# Patient Record
Sex: Female | Born: 1985 | Race: Black or African American | Hispanic: No | Marital: Married | Smoking: Never smoker
Health system: Southern US, Community
[De-identification: ages and names within clinical notes are randomized; demographics above are authoritative.]

## PROBLEM LIST (undated history)

## (undated) DIAGNOSIS — Z8489 Family history of other specified conditions: Secondary | ICD-10-CM

## (undated) DIAGNOSIS — N979 Female infertility, unspecified: Secondary | ICD-10-CM

## (undated) DIAGNOSIS — Z973 Presence of spectacles and contact lenses: Secondary | ICD-10-CM

## (undated) DIAGNOSIS — N971 Female infertility of tubal origin: Secondary | ICD-10-CM

---

## 2006-10-16 ENCOUNTER — Emergency Department (HOSPITAL_COMMUNITY): Admission: EM | Admit: 2006-10-16 | Discharge: 2006-10-16 | Payer: Self-pay | Admitting: Family Medicine

## 2007-03-31 ENCOUNTER — Other Ambulatory Visit: Admission: RE | Admit: 2007-03-31 | Discharge: 2007-03-31 | Payer: Self-pay | Admitting: *Deleted

## 2008-01-10 ENCOUNTER — Inpatient Hospital Stay (HOSPITAL_COMMUNITY): Admission: AD | Admit: 2008-01-10 | Discharge: 2008-01-13 | Payer: Self-pay | Admitting: Obstetrics and Gynecology

## 2008-01-11 ENCOUNTER — Encounter (INDEPENDENT_AMBULATORY_CARE_PROVIDER_SITE_OTHER): Payer: Self-pay | Admitting: Obstetrics and Gynecology

## 2008-07-24 ENCOUNTER — Encounter: Admission: RE | Admit: 2008-07-24 | Discharge: 2008-07-24 | Payer: Self-pay | Admitting: Family Medicine

## 2008-08-15 ENCOUNTER — Other Ambulatory Visit: Admission: RE | Admit: 2008-08-15 | Discharge: 2008-08-15 | Payer: Self-pay | Admitting: Family Medicine

## 2009-03-13 ENCOUNTER — Encounter: Admission: RE | Admit: 2009-03-13 | Discharge: 2009-03-13 | Payer: Self-pay | Admitting: Family Medicine

## 2009-08-29 ENCOUNTER — Emergency Department (HOSPITAL_COMMUNITY): Admission: EM | Admit: 2009-08-29 | Discharge: 2009-08-29 | Payer: Self-pay | Admitting: Family Medicine

## 2010-07-22 NOTE — Op Note (Signed)
[name], [name]              ACCOUNT NO.:  [id number]   MEDICAL RECORD NO.:  [id number]          PATIENT TYPE:  INP   LOCATION:  9111                          FACILITY:  [facility]   PHYSICIAN:  Maxie Better, M.D.DATE OF BIRTH:  August 27, 1985   DATE OF PROCEDURE:  01/11/2008  DATE OF DISCHARGE:                               OPERATIVE REPORT   PREOPERATIVE DIAGNOSIS:  Nonreassuring fetal tracing, post-term.   PROCEDURES:  Primary cesarean section for hysterotomy, extensive lysis  of adhesions.   POSTOPERATIVE DIAGNOSES:  Post-term, nonreassuring fetal tracing,  extensive pelvic adhesions.   ANESTHESIA:  Epidural.   SURGEON:  Maxie Better, MD   ASSISTANT:  Marlinda Mike, CNM   PROCEDURE IN DETAIL:  Under adequate epidural anesthesia, the patient  was placed in the supine position with a left lateral tilt.  She was  quickly sterilely prepped and draped in the usual fashion.  Indwelling  Foley catheter was already in place.  Marcaine 0.25% was injected along  the planned Pfannenstiel skin incision site.  A Pfannenstiel skin  incision was then made, carried down to the rectus fascia.  Rectus  fascia was opened transversely.  The rectus fascia was then bluntly and  sharply dissected off the rectus muscle in superior and inferior  fashion.  Rectus muscles split in midline.  Multiple attempts at  entering the peritoneum was unsuccessful.  Subsequently, peritoneum was  opened; however, there was felt to be a veil of scar tissue in the upper  part of the abdomen with the inability to palpate to reach the fundus.  The lower uterine segment was identified, attempt at opening the bladder  reflection transversely was limited, and therefore, a curvilinear low  transverse uterine incision was then made high, carried above what was  thought to be the bladder reflection and extended with bandage scissors.  Meconium-stained fluid was noted.  Subsequent delivery of a live female  from an  occiput posterior presentation was accomplished.  The infant was  bulb suctioned and the abdomen cord was clamped, cut, and the baby was  transferred to the awaiting pediatrician with Apgars of 9 and 10 at 1  and 5 minutes.  The placenta was manually removed.  Uterine cavity was  cleaned of debris.  Reattempt at opening of the peritoneum was then  attempted at which time that attempt was successful at the same time,  omental adhesion was noted across the lower segment area.  Once the  peritoneum was opened, the uterus was exteriorized to further assess the  anatomy.  On further assessm ent of the anatomy, the omental adhesion  was adherent to the lower uterine segment all the way across.  The right  tube was attached to the anterior aspect of the uterus superior to the  site of incision  The right ovary was normal and its location. On the  left, the distal portion of the tube  could not be identified at all  only its proximal segment and there was a small nubbing of ovary that  was identified, that appeared to be going underneath the tube as if it  is coming through that mesosalpinx.  The omental adhesion was lysed.  There were filmy/thin adhesions along the whole anterior/superior aspect  of the uterus anteriorly, which was not touched due to concern for  additional bleeding.  The tube and ovary location on the right was not  disturbed due to the fact that this patient obviously got pregnant  without any issue and due to what the left side appears to be, the  uterine incision was then identified, was closed in two layers using 0  Monocryl in running locked stitch, second layer was imbricated using 0  Monocryl stitch.  Opportunity was then taken to test the bladder,  however the normal anatomy in that area appeared distorted and  therefore, a decision was made to retrograde fill the bladder with 200  mL of methylene dye fluid and the bladder integrity was still in place  and there was no  leakage noted at that point.  The abdomen was copiously  irrigated and suctioned of debris.  The uterus was returned back to the  abdomen after all the adhesions had been removed.  Reinspection showed  good hemostasis.  Given the distortion of the anatomy of the bladder,  the peritoneum was not able to be closed.  The rectus fascia was then  closed with 0 Vicryl x2.  The subcutaneous area was irrigated and small  bleeders cauterized.  Interrupted 2-0 plain sutures placed and the skin  approximated using Ethicon staples.   Specimen was placenta, sent to pathology.  Estimated blood loss was 700  mL.  Urine output was 100 mL.  Fluid was 900 mL.  Sponge and instrument  counts x2 was correct.  Complication was none.  Weight of baby was 6  pounds 7 ounces.  The patient tolerated the procedure well, was  transferred to the recovery in stable condition.      Maxie Better, M.D.  Electronically Signed     [place]/MEDQ  D:  01/11/2008  T:  01/12/2008  Job:  [id number]

## 2010-07-25 NOTE — Discharge Summary (Signed)
[name], [name]              ACCOUNT NO.:  [id number]   MEDICAL RECORD NO.:  [id number]          PATIENT TYPE:  INP   LOCATION:  9111                          FACILITY:  [facility]   PHYSICIAN:  Maxie Better, M.D.DATE OF BIRTH:  1986-02-13   DATE OF ADMISSION:  01/10/2008  DATE OF DISCHARGE:  01/13/2008                               DISCHARGE SUMMARY   ADMISSION DIAGNOSIS:  Postterm.   DISCHARGE DIAGNOSES:  Postterm delivered, nonreassuring fetal heart  tracing, presumed chorioamnionitis, and severe pelvic adhesions   PROCEDURE:  Primary cesarean section and extensive lysis of adhesions.   HISTORY OF PRESENT ILLNESS:  A 25 year old gravida 1, para 0 female at  55 and 2/7 weeks' admitted by Marlinda Mike, midwife, for induction of  labor.   HOSPITAL COURSE:  The patient was admitted.  Her cervix was 150%, -3,  and Cytotec was used for cervical ripening.  The patient subsequently  progressed to 5-cm and 9% bulging bag of membranes.  She was doing  natural childbirth and tracing was reassuring.  She then had artificial  rupture of membranes.  Moderate to large amount of green meconium-  stained fluid.  Group B strep was negative.  Internal fetal scalp  electrode applied.  Intrauterine pressure catheter was subsequently  placed.  Amnioinfusion was started.  Epidural was started.  The cervix  had remained at 9 cm, 6, and 0 station.  The patient had a fetal heart  rate deceleration down to the 90s to 95 for 7 minutes.  Maternal blood  pressure was stable.  Ephedrine dose x1 was given without any change in  the fetal heart rate deceleration.  Given the cycle of the cervix had  remained unchanged and the fetal heart deceleration was noted.  Primary  cesarean section was recommended.  The patient was taken to the  operating room where she underwent a primary cesarean section, live  female from 6 pounds 7 ounces, occiput posterior presentation.  The  distal left fallopian tube end was  not seen.  A small left ovary was  noted, adherent.  The right tube and ovary were adherent to the anterior  uterus.  Omental adhesions at the bladder infection was noted.  Apgars  of 9 and 10.  Lysis of adhesions were carefully performed in order to do  the cesarean section.  Please see the dictated operative note for  specific details.  The patient was started on gentamicin and  clindamycin.  Postoperatively, she was continued on antibiotics.  On  postop day #2, the patient requesting to be discharged home.  Her  antibiotics at discharge, discontinued after 24 hours.  Her CBC, on  postop day #1, showed a hemoglobin 10.2, hematocrit 30.8, platelet count  of 105,000, and white count of 12.2.  Placenta was sent to Pathology and  it was matured placenta.  The patient's incision had no evidence of  infection or drainage.  She was deemed well to be discharged home.   DISPOSITION:  Home.   CONDITION:  Stable.   DISCHARGE MEDICATIONS:  1. Percocet 1-2 tablets every 6 hours p.r.n. pain.  2.  Motrin 600-800 mg every 8 hours p.r.n. pain.  3. Prenatal vitamins 1 p.o. daily.   FOLLOWUP APPOINTMENT:  At [facility] for staple removal on Monday  and 6 weeks postpartum.   DISCHARGE INSTRUCTIONS:  Per the postpartum booklet given.      Maxie Better, M.D.  Electronically Signed     [place]/MEDQ  D:  03/04/2008  T:  03/04/2008  Job:  [id number]

## 2010-10-06 ENCOUNTER — Other Ambulatory Visit (HOSPITAL_COMMUNITY)
Admission: RE | Admit: 2010-10-06 | Discharge: 2010-10-06 | Disposition: A | Discharge status: Home / Self Care | Payer: 59 | Source: Ambulatory Visit | Attending: Obstetrics and Gynecology | Admitting: Obstetrics and Gynecology

## 2010-10-06 ENCOUNTER — Other Ambulatory Visit: Payer: Self-pay | Admitting: Obstetrics and Gynecology

## 2010-10-06 DIAGNOSIS — Z01419 Encounter for gynecological examination (general) (routine) without abnormal findings: Secondary | ICD-10-CM | POA: Insufficient documentation

## 2010-12-09 LAB — CBC
HCT: 39.1
Hemoglobin: 12.9
MCHC: 33
MCHC: 33.1
MCV: 91.2
Platelets: 281
RBC: 3.34 — ABNORMAL LOW
RDW: 14.3
RDW: 14.9

## 2013-03-09 HISTORY — PX: WISDOM TOOTH EXTRACTION: SHX21

## 2013-04-28 ENCOUNTER — Other Ambulatory Visit (HOSPITAL_COMMUNITY): Payer: Self-pay | Admitting: Obstetrics and Gynecology

## 2013-04-28 DIAGNOSIS — N736 Female pelvic peritoneal adhesions (postinfective): Secondary | ICD-10-CM

## 2013-04-28 DIAGNOSIS — IMO0002 Reserved for concepts with insufficient information to code with codable children: Secondary | ICD-10-CM

## 2013-05-04 ENCOUNTER — Ambulatory Visit (HOSPITAL_COMMUNITY): Payer: PRIVATE HEALTH INSURANCE

## 2013-05-08 ENCOUNTER — Ambulatory Visit (HOSPITAL_COMMUNITY)
Admission: RE | Admit: 2013-05-08 | Discharge: 2013-05-08 | Disposition: A | Discharge status: Home / Self Care | Payer: PRIVATE HEALTH INSURANCE | Source: Ambulatory Visit | Attending: Obstetrics and Gynecology | Admitting: Obstetrics and Gynecology

## 2013-05-08 DIAGNOSIS — N7013 Chronic salpingitis and oophoritis: Secondary | ICD-10-CM | POA: Insufficient documentation

## 2013-05-08 DIAGNOSIS — IMO0002 Reserved for concepts with insufficient information to code with codable children: Secondary | ICD-10-CM

## 2013-05-08 DIAGNOSIS — N736 Female pelvic peritoneal adhesions (postinfective): Secondary | ICD-10-CM

## 2013-05-08 DIAGNOSIS — N971 Female infertility of tubal origin: Secondary | ICD-10-CM | POA: Insufficient documentation

## 2013-05-08 MED ORDER — IOHEXOL 300 MG/ML  SOLN
20.0000 mL | Freq: Once | INTRAMUSCULAR | Status: AC | PRN
  Administered 2013-05-08: 17 mL

## 2014-05-11 ENCOUNTER — Encounter (HOSPITAL_BASED_OUTPATIENT_CLINIC_OR_DEPARTMENT_OTHER): Payer: Self-pay | Admitting: *Deleted

## 2014-05-11 NOTE — Progress Notes (Signed)
NPO AFTER MN. ARRIVE AT 0815. NEEDS HG AND URINE PREG.  

## 2014-05-16 ENCOUNTER — Ambulatory Visit (HOSPITAL_BASED_OUTPATIENT_CLINIC_OR_DEPARTMENT_OTHER): Payer: 59 | Admitting: Anesthesiology

## 2014-05-16 ENCOUNTER — Ambulatory Visit (HOSPITAL_COMMUNITY)
Admission: RE | Admit: 2014-05-16 | Discharge: 2014-05-16 | Disposition: A | Discharge status: Home / Self Care | Payer: 59 | Source: Ambulatory Visit | Attending: Obstetrics and Gynecology | Admitting: Obstetrics and Gynecology

## 2014-05-16 ENCOUNTER — Encounter (HOSPITAL_BASED_OUTPATIENT_CLINIC_OR_DEPARTMENT_OTHER)
Admission: RE | Disposition: A | Discharge status: Home / Self Care | Payer: Self-pay | Source: Ambulatory Visit | Attending: Obstetrics and Gynecology

## 2014-05-16 ENCOUNTER — Encounter (HOSPITAL_BASED_OUTPATIENT_CLINIC_OR_DEPARTMENT_OTHER): Payer: Self-pay | Admitting: *Deleted

## 2014-05-16 DIAGNOSIS — N7011 Chronic salpingitis: Secondary | ICD-10-CM | POA: Insufficient documentation

## 2014-05-16 DIAGNOSIS — N736 Female pelvic peritoneal adhesions (postinfective): Secondary | ICD-10-CM | POA: Insufficient documentation

## 2014-05-16 DIAGNOSIS — N971 Female infertility of tubal origin: Secondary | ICD-10-CM | POA: Insufficient documentation

## 2014-05-16 HISTORY — DX: Family history of other specified conditions: Z84.89

## 2014-05-16 HISTORY — DX: Female infertility, unspecified: N97.9

## 2014-05-16 HISTORY — DX: Presence of spectacles and contact lenses: Z97.3

## 2014-05-16 HISTORY — DX: Female infertility of tubal origin: N97.1

## 2014-05-16 HISTORY — PX: LAPAROSCOPIC BILATERAL SALPINGECTOMY: SHX5889

## 2014-05-16 LAB — POCT PREGNANCY, URINE: PREG TEST UR: NEGATIVE

## 2014-05-16 LAB — POCT HEMOGLOBIN-HEMACUE: HEMOGLOBIN: 11.1 g/dL — AB (ref 12.0–15.0)

## 2014-05-16 SURGERY — SALPINGECTOMY, BILATERAL, LAPAROSCOPIC
Anesthesia: General | Site: Abdomen | Laterality: Bilateral

## 2014-05-16 MED ORDER — MIDAZOLAM HCL 5 MG/5ML IJ SOLN
INTRAMUSCULAR | Status: DC | PRN
  Administered 2014-05-16: 2 mg via INTRAVENOUS

## 2014-05-16 MED ORDER — KETOROLAC TROMETHAMINE 30 MG/ML IJ SOLN
INTRAMUSCULAR | Status: DC | PRN
  Administered 2014-05-16: 30 mg via INTRAVENOUS

## 2014-05-16 MED ORDER — FENTANYL CITRATE 0.05 MG/ML IJ SOLN
INTRAMUSCULAR | Status: DC | PRN
  Administered 2014-05-16 (×4): 50 ug via INTRAVENOUS

## 2014-05-16 MED ORDER — PHENYLEPHRINE HCL 10 MG/ML IJ SOLN
INTRAMUSCULAR | Status: DC | PRN
  Administered 2014-05-16: 160 ug via INTRAVENOUS

## 2014-05-16 MED ORDER — FENTANYL CITRATE 0.05 MG/ML IJ SOLN
INTRAMUSCULAR | Status: AC
  Filled 2014-05-16: qty 4

## 2014-05-16 MED ORDER — LACTATED RINGERS IR SOLN
Status: DC | PRN
  Administered 2014-05-16: 3000 mL

## 2014-05-16 MED ORDER — LACTATED RINGERS IV SOLN
INTRAVENOUS | Status: DC
  Administered 2014-05-16 (×2): via INTRAVENOUS
  Filled 2014-05-16: qty 1000

## 2014-05-16 MED ORDER — ONDANSETRON HCL 4 MG PO TABS: 4.0000 mg | ORAL_TABLET | Freq: Three times a day (TID) | ORAL | Status: AC | PRN

## 2014-05-16 MED ORDER — ROCURONIUM BROMIDE 100 MG/10ML IV SOLN
INTRAVENOUS | Status: DC | PRN
  Administered 2014-05-16: 40 mg via INTRAVENOUS

## 2014-05-16 MED ORDER — PROPOFOL 10 MG/ML IV BOLUS
INTRAVENOUS | Status: DC | PRN
  Administered 2014-05-16: 150 mg via INTRAVENOUS

## 2014-05-16 MED ORDER — OXYCODONE-ACETAMINOPHEN 7.5-325 MG PO TABS: 1.0000 | ORAL_TABLET | ORAL | Status: AC | PRN

## 2014-05-16 MED ORDER — DEXAMETHASONE SODIUM PHOSPHATE 4 MG/ML IJ SOLN
INTRAMUSCULAR | Status: DC | PRN
  Administered 2014-05-16: 10 mg via INTRAVENOUS

## 2014-05-16 MED ORDER — PROMETHAZINE HCL 25 MG/ML IJ SOLN
6.2500 mg | INTRAMUSCULAR | Status: DC | PRN
  Filled 2014-05-16: qty 1

## 2014-05-16 MED ORDER — CEFAZOLIN SODIUM-DEXTROSE 2-3 GM-% IV SOLR
2.0000 g | INTRAVENOUS | Status: AC
  Administered 2014-05-16: 2 g via INTRAVENOUS
  Filled 2014-05-16: qty 50

## 2014-05-16 MED ORDER — NEOSTIGMINE METHYLSULFATE 10 MG/10ML IV SOLN
INTRAVENOUS | Status: DC | PRN
  Administered 2014-05-16: 3 mg via INTRAVENOUS

## 2014-05-16 MED ORDER — ACETAMINOPHEN 10 MG/ML IV SOLN
INTRAVENOUS | Status: DC | PRN
  Administered 2014-05-16: 1000 mg via INTRAVENOUS

## 2014-05-16 MED ORDER — LIDOCAINE HCL (CARDIAC) 20 MG/ML IV SOLN
INTRAVENOUS | Status: DC | PRN
  Administered 2014-05-16: 50 mg via INTRAVENOUS

## 2014-05-16 MED ORDER — BUPIVACAINE-EPINEPHRINE 0.25% -1:200000 IJ SOLN
INTRAMUSCULAR | Status: DC | PRN
  Administered 2014-05-16: 6 mL

## 2014-05-16 MED ORDER — FENTANYL CITRATE 0.05 MG/ML IJ SOLN
25.0000 ug | INTRAMUSCULAR | Status: DC | PRN
  Filled 2014-05-16: qty 1

## 2014-05-16 MED ORDER — MIDAZOLAM HCL 2 MG/2ML IJ SOLN
INTRAMUSCULAR | Status: AC
  Filled 2014-05-16: qty 2

## 2014-05-16 MED ORDER — ONDANSETRON HCL 4 MG/2ML IJ SOLN
INTRAMUSCULAR | Status: DC | PRN
  Administered 2014-05-16: 4 mg via INTRAVENOUS

## 2014-05-16 MED ORDER — GLYCOPYRROLATE 0.2 MG/ML IJ SOLN
INTRAMUSCULAR | Status: DC | PRN
  Administered 2014-05-16: 0.4 mg via INTRAVENOUS
  Administered 2014-05-16: 0.2 mg via INTRAVENOUS

## 2014-05-16 MED ORDER — SODIUM CHLORIDE 0.9 % IR SOLN
Status: DC | PRN
  Administered 2014-05-16: 500 mL

## 2014-05-16 MED ORDER — LACTATED RINGERS IV SOLN
INTRAVENOUS | Status: DC
  Filled 2014-05-16: qty 1000

## 2014-05-16 MED ORDER — MEPERIDINE HCL 25 MG/ML IJ SOLN
6.2500 mg | INTRAMUSCULAR | Status: DC | PRN
  Filled 2014-05-16: qty 1

## 2014-05-16 MED ORDER — METHYLENE BLUE 1 % INJ SOLN
INTRAMUSCULAR | Status: DC | PRN
  Administered 2014-05-16: 1 mL

## 2014-05-16 MED ORDER — CEFAZOLIN SODIUM-DEXTROSE 2-3 GM-% IV SOLR
INTRAVENOUS | Status: AC
  Filled 2014-05-16: qty 50

## 2014-05-16 MED ORDER — STERILE WATER FOR IRRIGATION IR SOLN
Status: DC | PRN
  Administered 2014-05-16: 1000 mL

## 2014-05-16 SURGICAL SUPPLY — 56 items
APPLICATOR COTTON TIP 6IN STRL (MISCELLANEOUS) ×2 IMPLANT
BAG URINE DRAINAGE (UROLOGICAL SUPPLIES) ×2 IMPLANT
BENZOIN TINCTURE PRP APPL 2/3 (GAUZE/BANDAGES/DRESSINGS) ×2 IMPLANT
BLADE SURG 11 STRL SS (BLADE) ×2 IMPLANT
CATH FOLEY 2WAY SLVR  5CC 14FR (CATHETERS) ×1
CATH FOLEY 2WAY SLVR 5CC 14FR (CATHETERS) ×1 IMPLANT
CATH ROBINSON RED A/P 16FR (CATHETERS) ×2 IMPLANT
CATH SALPINGOGRAPHY SELECT (BALLOONS) ×2 IMPLANT
COVER MAYO STAND STRL (DRAPES) ×4 IMPLANT
COVER TABLE BACK 60X90 (DRAPES) ×2 IMPLANT
DRAPE UNDERBUTTOCKS STRL (DRAPE) ×2 IMPLANT
ELECT REM PT RETURN 9FT ADLT (ELECTROSURGICAL) ×2
ELECTRODE REM PT RTRN 9FT ADLT (ELECTROSURGICAL) ×1 IMPLANT
GLOVE BIO SURGEON STRL SZ 6.5 (GLOVE) ×8 IMPLANT
GLOVE BIOGEL PI IND STRL 7.5 (GLOVE) ×1 IMPLANT
GLOVE BIOGEL PI IND STRL 8.5 (GLOVE) ×2 IMPLANT
GLOVE BIOGEL PI INDICATOR 7.5 (GLOVE) ×1
GLOVE BIOGEL PI INDICATOR 8.5 (GLOVE) ×2
GLOVE INDICATOR 6.5 STRL GRN (GLOVE) ×6 IMPLANT
GLOVE INDICATOR 7.5 STRL GRN (GLOVE) ×6 IMPLANT
GOWN STRL REUS W/ TWL LRG LVL3 (GOWN DISPOSABLE) ×2 IMPLANT
GOWN STRL REUS W/ TWL XL LVL3 (GOWN DISPOSABLE) ×1 IMPLANT
GOWN STRL REUS W/TWL LRG LVL3 (GOWN DISPOSABLE) ×2
GOWN STRL REUS W/TWL XL LVL3 (GOWN DISPOSABLE) ×1
IV LACTATED RINGER IRRG 3000ML (IV SOLUTION) ×1
IV LR IRRIG 3000ML ARTHROMATIC (IV SOLUTION) ×1 IMPLANT
MANIPULATOR UTERINE 4.5 ZUMI (MISCELLANEOUS) ×2 IMPLANT
NEEDLE HYPO 25X1 1.5 SAFETY (NEEDLE) ×2 IMPLANT
NEEDLE INSUFFLATION 14GA 120MM (NEEDLE) ×2 IMPLANT
NEEDLE SPNL 20GX3.5 QUINCKE YW (NEEDLE) ×2 IMPLANT
NS IRRIG 500ML POUR BTL (IV SOLUTION) ×2 IMPLANT
PACK BASIN DAY SURGERY FS (CUSTOM PROCEDURE TRAY) ×2 IMPLANT
PACK LAPAROSCOPY II (CUSTOM PROCEDURE TRAY) ×2 IMPLANT
PAD OB MATERNITY 4.3X12.25 (PERSONAL CARE ITEMS) ×2 IMPLANT
PLUG CATH AND CAP STER (CATHETERS) ×2 IMPLANT
SCALPEL HARMONIC ACE (MISCELLANEOUS) ×2 IMPLANT
SEPRAFILM MEMBRANE 5X6 (MISCELLANEOUS) ×4 IMPLANT
SET IRRIG TUBING LAPAROSCOPIC (IRRIGATION / IRRIGATOR) ×2 IMPLANT
SOLUTION ANTI FOG 6CC (MISCELLANEOUS) ×2 IMPLANT
STOPCOCK 4 WAY LG BORE MALE ST (IV SETS) ×2 IMPLANT
STRIP CLOSURE SKIN 1/2X4 (GAUZE/BANDAGES/DRESSINGS) ×2 IMPLANT
SUT MON AB-0 CT1 36 (SUTURE) ×2 IMPLANT
SUT VIC AB 2-0 UR6 27 (SUTURE) ×2 IMPLANT
SYR 20CC LL (SYRINGE) ×2 IMPLANT
SYR 30ML LL (SYRINGE) ×2 IMPLANT
SYR 3ML 23GX1 SAFETY (SYRINGE) ×2 IMPLANT
SYR 5ML LL (SYRINGE) ×2 IMPLANT
SYR CONTROL 10ML LL (SYRINGE) ×4 IMPLANT
SYRINGE 10CC LL (SYRINGE) ×2 IMPLANT
SYRINGE IRR TOOMEY STRL 70CC (SYRINGE) ×2 IMPLANT
TOWEL OR 17X24 6PK STRL BLUE (TOWEL DISPOSABLE) ×6 IMPLANT
TRAY DSU PREP LF (CUSTOM PROCEDURE TRAY) ×2 IMPLANT
TROCAR OPTI TIP 5M 100M (ENDOMECHANICALS) ×2 IMPLANT
TUBING INSUFFLATION 10FT LAP (TUBING) ×2 IMPLANT
WARMER LAPAROSCOPE (MISCELLANEOUS) ×2 IMPLANT
WATER STERILE IRR 500ML POUR (IV SOLUTION) ×4 IMPLANT

## 2014-05-16 NOTE — Anesthesia Preprocedure Evaluation (Addendum)
Anesthesia Evaluation  Patient identified by MRN, date of birth, ID band Patient awake    Reviewed: Allergy & Precautions, NPO status , Patient's Chart, lab work & pertinent test results  Airway Mallampati: II  TM Distance: >3 FB Neck ROM: Full    Dental no notable dental hx. (+) Teeth Intact, Dental Advisory Given   Pulmonary neg pulmonary ROS,  breath sounds clear to auscultation  Pulmonary exam normal       Cardiovascular Exercise Tolerance: Good negative cardio ROS  Rhythm:Regular Rate:Normal     Neuro/Psych negative neurological ROS  negative psych ROS   GI/Hepatic negative GI ROS, Neg liver ROS,   Endo/Other  negative endocrine ROS  Renal/GU negative Renal ROS  negative genitourinary   Musculoskeletal negative musculoskeletal ROS (+)   Abdominal   Peds negative pediatric ROS (+)  Hematology negative hematology ROS (+)   Anesthesia Other Findings   Reproductive/Obstetrics negative OB ROS                           Anesthesia Physical Anesthesia Plan  ASA: I  Anesthesia Plan: General   Post-op Pain Management:    Induction: Intravenous  Airway Management Planned: Oral ETT  Additional Equipment:   Intra-op Plan:   Post-operative Plan: Extubation in OR  Informed Consent: I have reviewed the patients History and Physical, chart, labs and discussed the procedure including the risks, benefits and alternatives for the proposed anesthesia with the patient or authorized representative who has indicated his/her understanding and acceptance.   Dental advisory given  Plan Discussed with: CRNA and Anesthesiologist  Anesthesia Plan Comments:        Anesthesia Quick Evaluation

## 2014-05-16 NOTE — Anesthesia Postprocedure Evaluation (Signed)
  Anesthesia Post-op Note  Patient: [name]  Procedure(s) Performed: Procedure(s) (LRB): LAPAROSCOPY/LYSIS OF ADHESIONS/CHROMOTUBATION; BILATERAL SALPINGECTOMY (Bilateral)  Patient Location: PACU  Anesthesia Type: General  Level of Consciousness: awake and alert   Airway and Oxygen Therapy: Patient Spontanous Breathing  Post-op Pain: mild  Post-op Assessment: Post-op Vital signs reviewed, Patient's Cardiovascular Status Stable, Respiratory Function Stable, Patent Airway and No signs of Nausea or vomiting  Last Vitals:  Filed Vitals:   05/16/14 1731  BP: 114/70  Pulse:   Temp: 37.1 C  Resp: 16    Post-op Vital Signs: stable   Complications: No apparent anesthesia complications

## 2014-05-16 NOTE — Discharge Instructions (Signed)
°  Post Anesthesia Home Care Instructions ° °Activity: °Get plenty of rest for the remainder of the day. A responsible adult should stay with you for 24 hours following the procedure.  °For the next 24 hours, DO NOT: °-Drive a car °-Operate machinery °-Drink alcoholic beverages °-Take any medication unless instructed by your physician °-Make any legal decisions or sign important papers. ° °Meals: °Start with liquid foods such as gelatin or soup. Progress to regular foods as tolerated. Avoid greasy, spicy, heavy foods. If nausea and/or vomiting occur, drink only clear liquids until the nausea and/or vomiting subsides. Call your physician if vomiting continues. ° °Special Instructions/Symptoms: °Your throat may feel dry or sore from the anesthesia or the breathing tube placed in your throat during surgery. If this causes discomfort, gargle with warm salt water. The discomfort should disappear within 24 hours. ° °HOME CARE INSTRUCTIONS - LAPAROSCOPY ° °Wound Care: The bandaids or dressing which are placed over the skin openings may be removed the day after surgery. The incision should be kept clean and dry. The stitches do not need to be removed. Should the incision become sore, red, and swollen after the first week, check with your doctor. ° °Personal Hygiene: Shower the day after your procedure. Always wipe from front to back after elimination.  ° °Activity: Do not drive or operate any equipment today. The effects of the anesthesia are still present and drowsiness may result. Rest today, not necessarily flat bed rest, just take it easy. You may resume your normal activity in one to three days or as instructed by your physician. ° °Sexual Activity: You resume sexual activity as indicated by your physician_________. °If your laparoscopy was for a sterilization ( tubes tied ), continue current method of birth control until after your next period or ask for specific instructions from your doctor. ° °Diet: Eat a light diet  as desired this evening. You may resume a regular diet tomorrow. ° °Return to Work: Two to three days or as indicated by your doctor. ° °Expectations °After Surgery: Your surgery will cause vaginal drainage or spotting which may continue for 2-3 days. Mild abdominal discomfort or tenderness is not unusual and some shoulder pain may also be noted which can be relieved by lying flat in pain. ° °Call Your Doctor °If these Occur:  Persistent or heavy bleeding at incision site °      Redness or swelling around incision °      Elevation of temperature greater than 100 degrees F ° °Call for follow-up appointment _____________. °

## 2014-05-16 NOTE — H&P (Signed)
[name] is a 29 y.o. female , originally referred to me by Dr. Cherly Hensen, for bilateral hydrosalpinx and pelvic adhesions.  She mentions 5 years of unprotected intercourse with no pregnancy.  She also describes midcycle significant pelvic pain.There is no posterior cul-de-sac nodularity or induration.  Uterus is anteverted and mobile.  There are no adnexal masses.  Pertinent Gynecological History: Menses: Normal Bleeding:Normal Contraception: none DES exposure: denies Blood transfusions: none Sexually transmitted diseases: no past history Previous GYN Procedures: Cesarean Delivery; Lysis of Adhesions  Last mammogram: n/a Last pap: normal  OB History: G1, P1   Menstrual History: Menarche age: 52 No LMP recorded.    Past Medical History  Diagnosis Date  . Infertility, female   . Tubal occlusion     BILATERAL  . Wears contact lenses   . Family history of adverse reaction to anesthesia     MOTHER-- PONV                    Past Surgical History  Procedure Laterality Date  . Cesarean section  01-11-2008    AND EXTENSIVE LYSIS ADHESIONS  . Wisdom tooth extraction  2015             History reviewed. No pertinent family history. No hereditary disease.  No cancer of breast, ovary, uterus. No cutaneous leiomyomatosis or renal cell carcinoma.  History   Social History  . Marital Status: Married    Spouse Name: N/A  . Number of Children: N/A  . Years of Education: N/A   Occupational History  . Not on file.   Social History Main Topics  . Smoking status: Never Smoker   . Smokeless tobacco: Never Used  . Alcohol Use: No  . Drug Use: No  . Sexual Activity: Not on file   Other Topics Concern  . Not on file   Social History Narrative    No Known Allergies  No current facility-administered medications on file prior to encounter.   No current outpatient prescriptions on file prior to encounter.     Review of Systems  Constitutional: Negative.   HENT:  Negative.   Eyes: Negative.   Respiratory: Negative.   Cardiovascular: Negative.   Gastrointestinal: Negative.   Genitourinary: Negative.   Musculoskeletal: Negative.   Skin: Negative.   Neurological: Negative.   Endo/Heme/Allergies: Negative.   Psychiatric/Behavioral: Negative.      Physical Exam  BP 108/70 mmHg  Pulse 88  Temp(Src) 98.2 F (36.8 C) (Oral)  Resp 14  Ht  (1.626 m)  Wt 54.205 kg (119 lb 8 oz)  BMI 20.50 kg/m2  SpO2 100%  LMP 05/05/2014 (Exact Date) Constitutional: She is oriented to person, place, and time. She appears well-developed and well-nourished.  HENT:  Head: Normocephalic and atraumatic.  Nose: Nose normal.  Mouth/Throat: Oropharynx is clear and moist. No oropharyngeal exudate.  Eyes: Conjunctivae normal and EOM are normal. Pupils are equal, round, and reactive to light. No scleral icterus.  Neck: Normal range of motion. Neck supple. No tracheal deviation present. No thyromegaly present.  Cardiovascular: Normal rate.   Respiratory: Effort normal and breath sounds normal.  GI: Soft. Bowel sounds are normal. She exhibits no distension and no mass. There is no tenderness.  Lymphadenopathy:    She has no cervical adenopathy.  Neurological: She is alert and oriented to person, place, and time. She has normal reflexes.  Skin: Skin is warm.  Psychiatric: She has a normal mood and affect. Her behavior is normal.  Judgment and thought content normal.       Assessment/Plan:  Patient will undergo laparoscopy, lysis of adhesions, possible right tuboplasty, hysteroscopy, possible  FTC of the left tube for proximal occlusion. I reviewed the benefits, risks, as well as the expected chances of increased infertility and improvement in pelvic pain postoperatively.

## 2014-05-16 NOTE — Op Note (Signed)
Operative Note  Preoperative diagnosis: Right hydrosalpinx, left proximal tubal occlusion, pelvic adhesions  Postoperative diagnosis: Right hydrosalpinx, left proximal and distal tubal occlusion, dense pelvic adhesions  Procedure: Laparoscopy, extensive lysis of adhesions, enterolysis, bilateral salpingectomy, chromopertubation  Anesthesia: Gen. endotracheal  Complications: None  Estimated blood loss: 50 cc  Specimens: Both fallopian tubes to pathology  Findings: On laparoscopy, upper abdomen, liver surface and diaphragm surfaces as well as gallbladder were normal. The appendix was within normal limits. There were filmy omental adhesions to the midline of the anterior abdominal wall in cephalad direction. Lower down in the abdomen, we encountered dense sigmoid incisions followed by cohesive adhesions of the uterus to the anterior abdominal wall with involvement of both tubes and left ovary in the uterine-anterior abdominal wall adhesions. The left tube could only be traced in its proximal one third and was then retroperitonealized in the parietal endometrium of the anterior abdominal wall and further covered by the adhesion between the uterus and the anterior abdominal. The proximal aspect of the tube looped around a small left ovary which was also pulled up against the anterior abdominal wall and most cohesively adherent. The ovary could be freed up without damaging the left infundibulopelvic ligament. The left tube was traced to a saccular structure on the anterior abdominal wall, after extensive lysis of adhesions. The tip of this saclike structure contained white pasty contents, and it may have been residual from a contrast material used during HSG. The right fallopian tube could be traced only in its proximal one half and the rest was stuck to the anterior abdominal wall and was also involved in the uterine anterior wall adhesions. During chromotubation which was performed after extensive lysis  of uterine adhesions, it showed hydrosalpinx and could be somewhat differentiated from the rest of the anterior cul-de-sac adhesions. The right ovary was one third covered with epiploic adhesions and it was pulled anteriorly because of the right tubal adhesions. The uterus had small subserosal/intramural myomas. There were no lesions of peritoneal endometriosis.  Description of the procedure: The patient was placed in dorsal supine position and general endotracheal anesthesia was given. 2 g of cefazolin were given intravenously for prophylaxis. Patient was placed in lithotomy position. She was prepped and draped inside manner. A Foley catheter was inserted into the bladder. A ZUMI catheter was placed into the uterine cavity. The uterus sounded to 10 centimeters. The surgeon was regloved and a surgical field was created on the abdomen.  After preemptive anesthesia of all surgical sites with 0.25% bupivacaine with 1 200,000 epinephrine, a 5 mm intraumbilical skin incision was made and a Verress needle was inserted. Its correct location was confirmed. A pneumoperitoneum was created with carbon dioxide. A left lower quadrant 5 mm and a right lower quadrant 5 mm incisions were made and ancillary trochars were placed under direct visualization. Above findings were noted.  We first used a needle electrode with 35 W of cutting current and lysed the filmy omental adhesions to the midline anterior abdominal wall. Then a series of extensive blunt and sharp dissection, with scissors, needle electrode and occasionally harmonic Ace, in order to free up the uterine fundus and anterior surface of the uterus from its adherence to the anterior abdominal wall and then carefully freeing up left ovary and, to a lesser extent right ovary from there anterior dislocation and adherence onto the anterior abdominal wall, making sure the infundibulopelvic ligaments are not compromised. After the uterus was completely mobilized, both  tubes were unfortunately  still remained in there retroperitoneal adherent condition and a dissection plane could not easily be developed. Therefore a decision was made to remove the left tube, whose distal and contained white pastelike contents into this lumen, probably residual from previous HSG is contrast material. Harmonic Ace was applied to the uterotubal junction and it was kept on traction and series of Harmonic Ace applications along us to remove the left tube from the anterior abdominal wall. Further lysis of adhesions on the right fallopian tube freed up the organ but it was completely denuded of its serosa and maintained its convoluted course because of loop to loop adhesions and upon chromotubation, patency could not the established.  At this point I called patient's husband on the phone and explained to him the situation and the futility of keeping the right tube in situ, where it would be detrimental to embryo implantation with any future ART. Her husband approved my recommendation to remove the right tube as well. Right salpingectomy was accomplished by severing the uterotubal junction with harmonic Ace and then applying it and coagulating/cutting the tube along the identifiable mesosalpinx after placing the tube on traction. Both tubal specimens were removed through the right lower incision which was extended to 10 mm and they were submitted to pathology. The bladder wall integrity was checked by retrogradely filling the bladder with 400 mL of saline and was found to be satisfactory. The bladder was drained. Pelvis was copiously irrigated and aspirated. Hemostasis was adequate. A slurry of 2 sheets of Seprafilm in 50 mL of saline was instilled into the anterior cul-de-sac as an adhesion barrier. The gas was allowed to escape. A 2-0 Vicryl suture was placed on the fascia of the right lower quadrant 10 mm incision. Rest of the skin incisions were approximated with 4-0 Monocryl in subcuticular  stitches.  The patient tolerated the procedure well and was transferred to recovery room in satisfactory condition.   Fermin SchwabYALCINKAYA,Lam Mccubbins

## 2014-05-16 NOTE — Transfer of Care (Signed)
Immediate Anesthesia Transfer of Care Note  Patient: Brandi PyoSierra Kramer  Procedure(s) Performed: Procedure(s): LAPAROSCOPY/LYSIS OF ADHESIONS/CHROMOTUBATION; BILATERAL SALPINGECTOMY (Bilateral)  Patient Location: PACU  Anesthesia Type:General  Level of Consciousness: awake, alert , oriented and patient cooperative  Airway & Oxygen Therapy: Patient Spontanous Breathing and Patient connected to nasal cannula oxygen  Post-op Assessment: Report given to RN and Post -op Vital signs reviewed and stable  Post vital signs: Reviewed and stable  Last Vitals:  Filed Vitals:   05/16/14 0826  BP: 108/70  Pulse: 88  Temp: 36.8 C  Resp: 14    Complications: No apparent anesthesia complications

## 2014-05-16 NOTE — Anesthesia Procedure Notes (Signed)
Procedure Name: Intubation Date/Time: 05/16/2014 11:51 AM Performed by: Tyrone NineSAUVE, Ginnifer Creelman F Pre-anesthesia Checklist: Patient identified, Timeout performed, Emergency Drugs available, Suction available and Patient being monitored Patient Re-evaluated:Patient Re-evaluated prior to inductionOxygen Delivery Method: Circle system utilized Preoxygenation: Pre-oxygenation with 100% oxygen Intubation Type: IV induction Ventilation: Mask ventilation without difficulty Laryngoscope Size: Mac and 3 Number of attempts: 1 Airway Equipment and Method: Stylet and Bite block Placement Confirmation: breath sounds checked- equal and bilateral,  ETT inserted through vocal cords under direct vision and positive ETCO2 Secured at: 22 cm Tube secured with: Tape Dental Injury: Teeth and Oropharynx as per pre-operative assessment

## 2014-05-17 ENCOUNTER — Encounter (HOSPITAL_BASED_OUTPATIENT_CLINIC_OR_DEPARTMENT_OTHER): Payer: Self-pay | Admitting: Obstetrics and Gynecology

## 2014-05-28 NOTE — Addendum Note (Signed)
Addendum  created 05/28/14 1547 by Sherrian DiversBruce Eulamae Greenstein, MD   Modules edited: Anesthesia Attestations

## 2014-11-09 IMAGING — RF DG HYSTEROGRAM
10 series · 10 of 10 positions shown · IV contrast (omnipaque)
Comparison: None.

FLUOROSCOPY TIME:  1 min 18 seconds

CLINICAL DATA: Secondary infertility. Pelvic peritoneal adhesions.

EXAM:
HYSTEROSALPINGOGRAM
TECHNIQUE: Following cleansing of the cervix and vagina with Betadine solution,
a hysterosalpingogram was performed using a 5-French
hysterosalpingogram catheter and Omnipaque 300 contrast. The patient
tolerated the examination without difficulty.

[Series 1: run · 1 of 1 slices shown (1 of 10)]
[im 1/1]
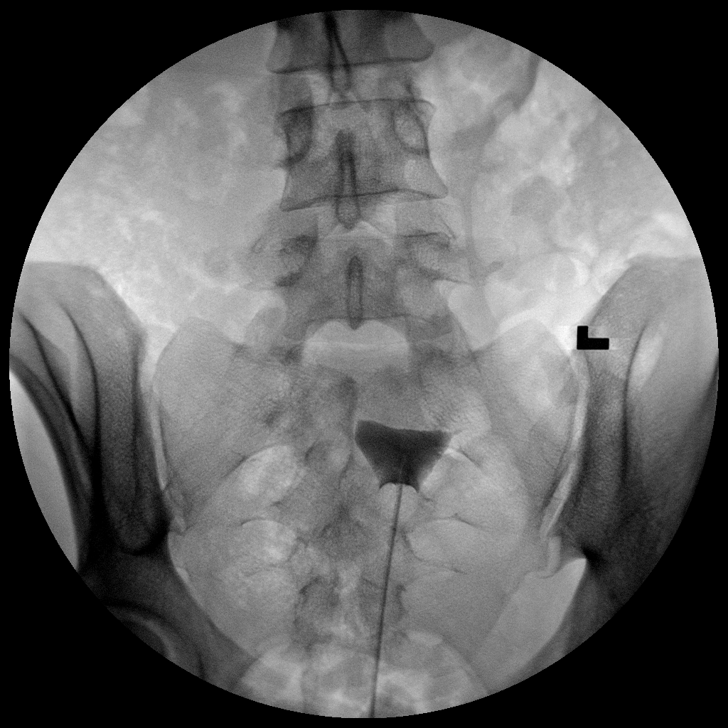

[Series 2: run · 1 of 1 slices shown (2 of 10)]
[im 1/1]
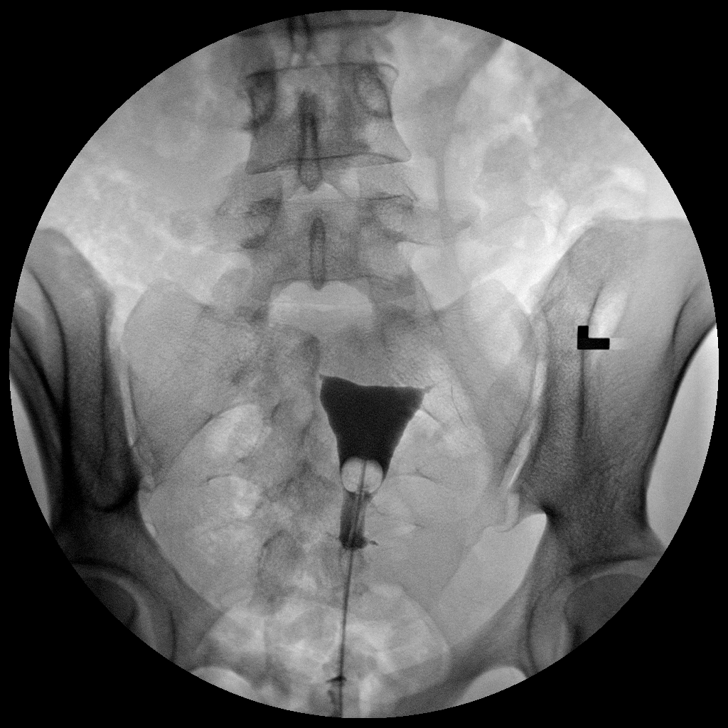

[Series 3: run · 1 of 1 slices shown (3 of 10)]
[im 1/1]
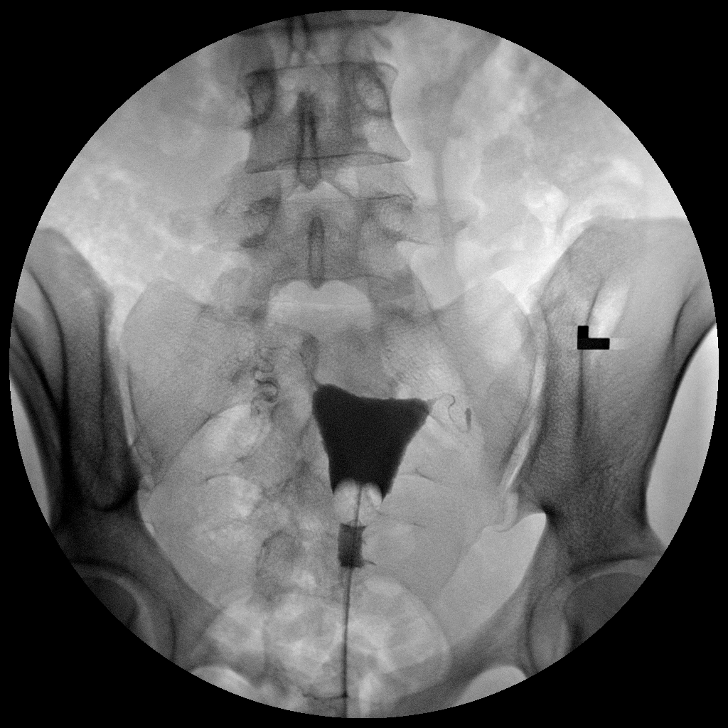

[Series 4: run · 1 of 1 slices shown (4 of 10)]
[im 1/1]
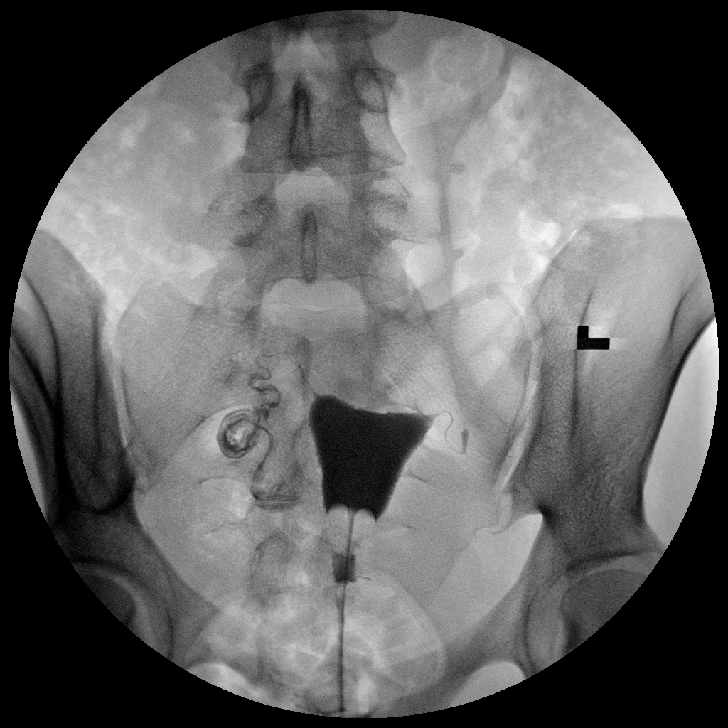

[Series 5: run · 1 of 1 slices shown (5 of 10)]
[im 1/1]
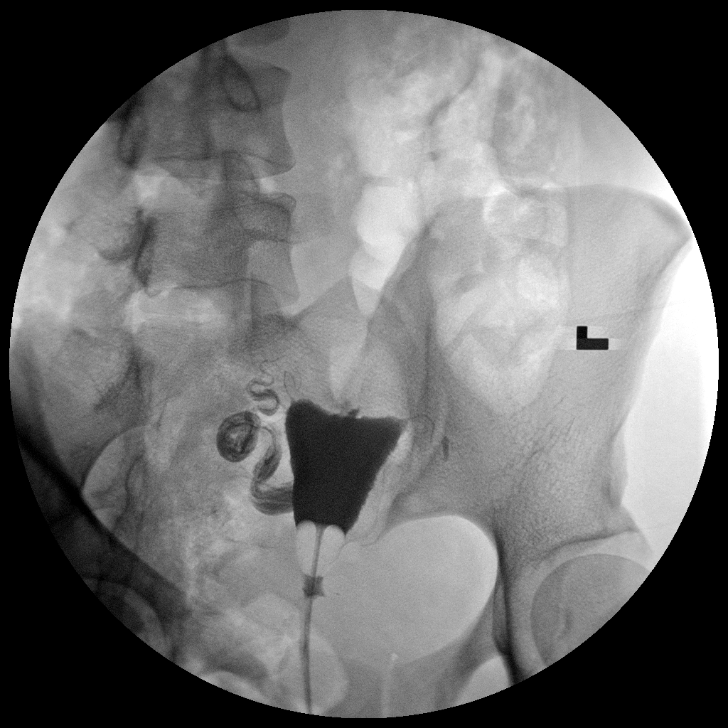

[Series 6: run · 1 of 1 slices shown (6 of 10)]
[im 1/1]
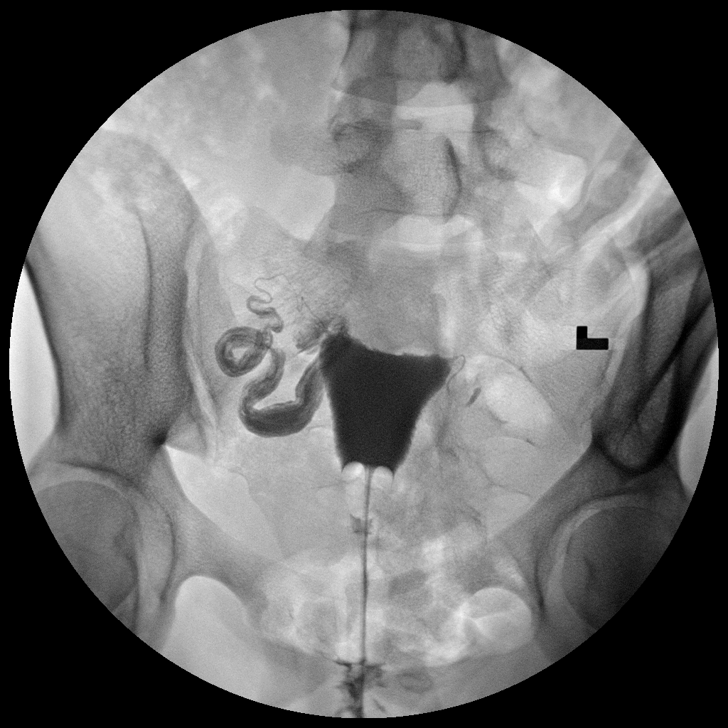

[Series 7: run · 1 of 1 slices shown (7 of 10)]
[im 1/1]
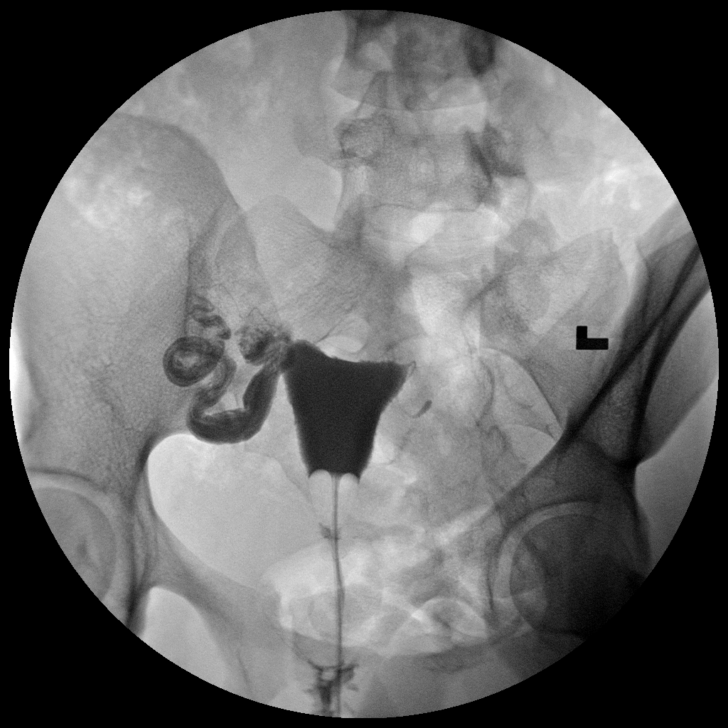

[Series 8: run · 1 of 1 slices shown (8 of 10)]
[im 1/1]
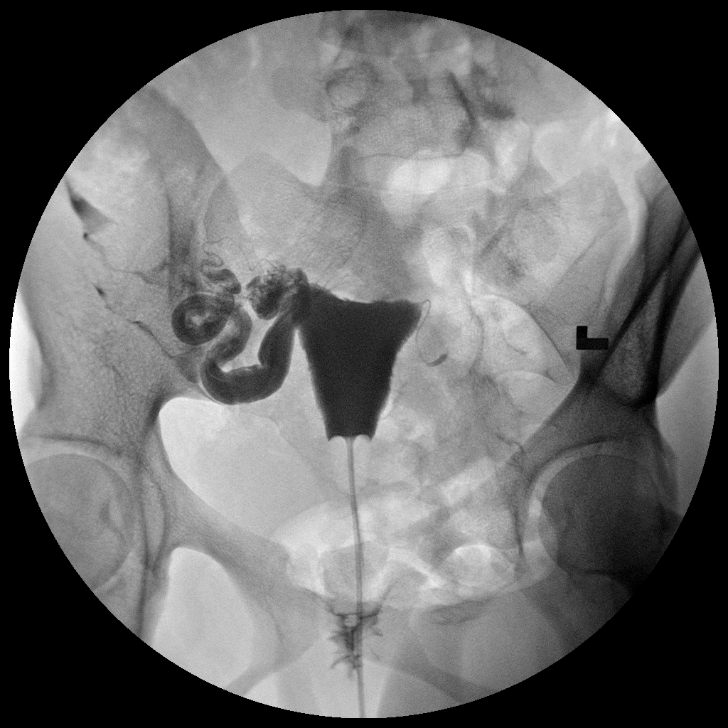

[Series 9: run · 1 of 1 slices shown (9 of 10)]
[im 1/1]
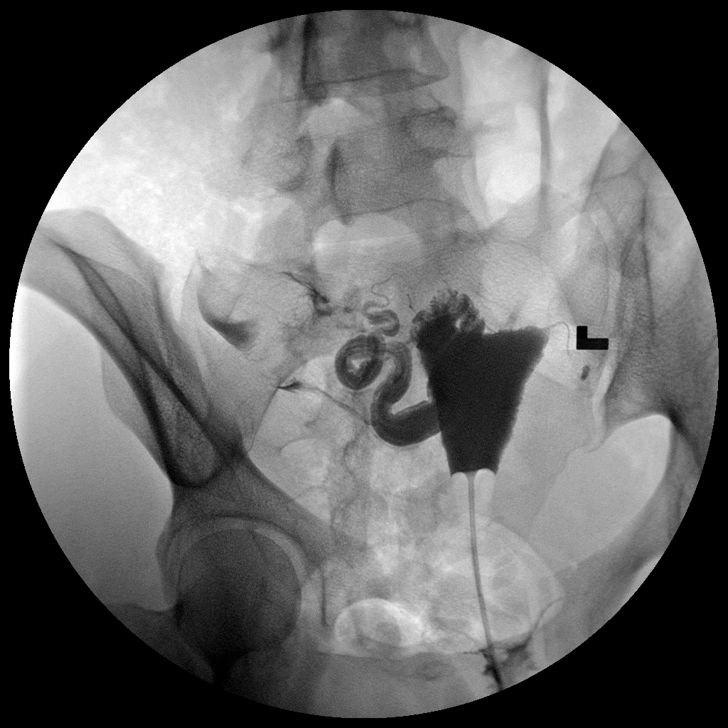

[Series 10: run · 1 of 1 slices shown (10 of 10)]
[im 1/1]
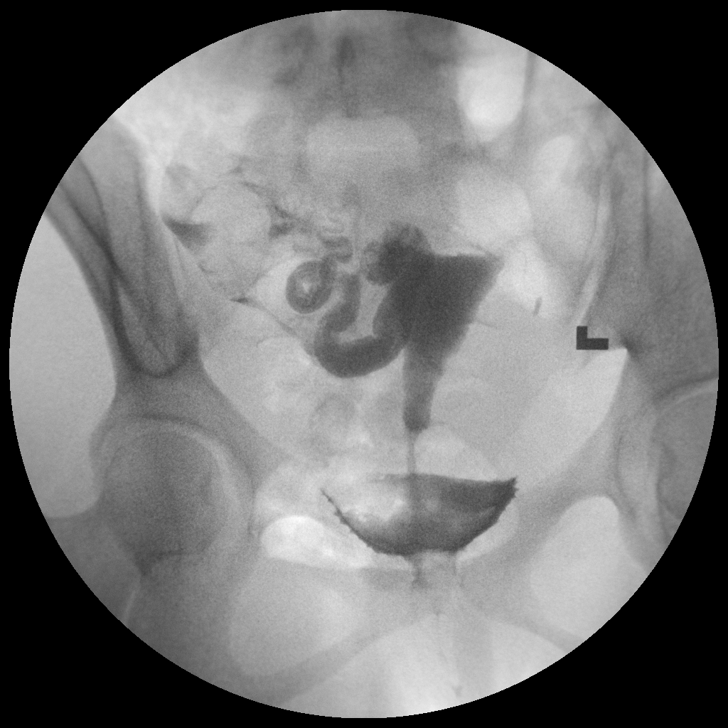

[10 of 10 positions shown; findings below may reference images not displayed]

FINDINGS: The endometrial cavity of the uterus is normal in contour and
appearance. No evidence of Mullerian duct anomaly.

Opacification of the proximal left fallopian tube is seen, however
the left fallopian tube is occluded proximally.

Complete opacification of the right fallopian tube is seen which
shows mild to moderate dilatation. Intraperitoneal spill of contrast
from right fallopian tube is demonstrated.
IMPRESSION: Mild to moderate right hydrosalpinx, however right fallopian tube is
patent.

Proximal occlusion of the left fallopian tube.

Normal appearance of endometrial cavity.

## 2019-04-20 ENCOUNTER — Ambulatory Visit: Payer: PRIVATE HEALTH INSURANCE

## 2019-04-24 ENCOUNTER — Ambulatory Visit: Payer: PRIVATE HEALTH INSURANCE | Attending: Internal Medicine

## 2020-05-13 ENCOUNTER — Encounter: Payer: Self-pay | Admitting: Certified Nurse Midwife

## 2020-05-13 ENCOUNTER — Other Ambulatory Visit: Payer: Self-pay
